# Patient Record
Sex: Male | Born: 1995 | Race: Black or African American | Hispanic: No | Marital: Single | Smoking: Never smoker
Health system: Southern US, Community
[De-identification: ages and names within clinical notes are randomized; demographics above are authoritative.]

## PROBLEM LIST (undated history)

## (undated) DIAGNOSIS — J45909 Unspecified asthma, uncomplicated: Secondary | ICD-10-CM

## (undated) HISTORY — PX: OTHER SURGICAL HISTORY: SHX169

---

## 2019-01-31 ENCOUNTER — Emergency Department (HOSPITAL_COMMUNITY)
Admission: EM | Admit: 2019-01-31 | Discharge: 2019-01-31 | Disposition: A | Discharge status: Home / Self Care | Payer: Medicaid - Out of State | Attending: Emergency Medicine | Admitting: Emergency Medicine

## 2019-01-31 ENCOUNTER — Other Ambulatory Visit: Payer: Self-pay

## 2019-01-31 ENCOUNTER — Encounter (HOSPITAL_COMMUNITY): Payer: Self-pay | Admitting: Emergency Medicine

## 2019-01-31 ENCOUNTER — Emergency Department (HOSPITAL_COMMUNITY): Payer: Medicaid - Out of State

## 2019-01-31 DIAGNOSIS — Y929 Unspecified place or not applicable: Secondary | ICD-10-CM | POA: Insufficient documentation

## 2019-01-31 DIAGNOSIS — Y9389 Activity, other specified: Secondary | ICD-10-CM | POA: Diagnosis not present

## 2019-01-31 DIAGNOSIS — X58XXXA Exposure to other specified factors, initial encounter: Secondary | ICD-10-CM | POA: Insufficient documentation

## 2019-01-31 DIAGNOSIS — Y999 Unspecified external cause status: Secondary | ICD-10-CM | POA: Insufficient documentation

## 2019-01-31 DIAGNOSIS — J45909 Unspecified asthma, uncomplicated: Secondary | ICD-10-CM | POA: Insufficient documentation

## 2019-01-31 DIAGNOSIS — S60221A Contusion of right hand, initial encounter: Secondary | ICD-10-CM | POA: Diagnosis not present

## 2019-01-31 DIAGNOSIS — S6991XA Unspecified injury of right wrist, hand and finger(s), initial encounter: Secondary | ICD-10-CM | POA: Diagnosis present

## 2019-01-31 HISTORY — DX: Unspecified asthma, uncomplicated: J45.909

## 2019-01-31 MED ORDER — IBUPROFEN 800 MG PO TABS
800.0000 mg | ORAL_TABLET | Freq: Once | ORAL | Status: AC
  Administered 2019-01-31: 22:00:00 800 mg via ORAL
  Filled 2019-01-31: qty 1

## 2019-01-31 MED ORDER — IBUPROFEN 800 MG PO TABS: 800.0000 mg | ORAL_TABLET | Freq: Three times a day (TID) | ORAL | 0 refills | Status: DC

## 2019-01-31 NOTE — ED Triage Notes (Signed)
Pt was working on a vehicle when he hit his hand on something and now c/o R hand pain.

## 2019-01-31 NOTE — Discharge Instructions (Addendum)
Rest your hand and wrist by wearing the wrist splint as much as possible, however, remove it several times daily to flex your wrist to prevent stiffness.  Ice and elevation will also help with pain and healing.  See your doctor for a recheck if it is not improving over the next 7-10 days.
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
## 2019-02-15 ENCOUNTER — Emergency Department (HOSPITAL_COMMUNITY)
Admission: EM | Admit: 2019-02-15 | Discharge: 2019-02-15 | Disposition: A | Discharge status: Home / Self Care | Payer: Medicaid - Out of State | Attending: Emergency Medicine | Admitting: Emergency Medicine

## 2019-02-15 ENCOUNTER — Other Ambulatory Visit: Payer: Self-pay

## 2019-02-15 ENCOUNTER — Encounter (HOSPITAL_COMMUNITY): Payer: Self-pay | Admitting: Emergency Medicine

## 2019-02-15 DIAGNOSIS — T7840XA Allergy, unspecified, initial encounter: Secondary | ICD-10-CM | POA: Diagnosis not present

## 2019-02-15 DIAGNOSIS — R21 Rash and other nonspecific skin eruption: Secondary | ICD-10-CM | POA: Diagnosis not present

## 2019-02-15 NOTE — ED Triage Notes (Signed)
Pt states he feels he is having an allergic reaction to penicillin he was given for his tooth. States he has been having a cough, sob, and a rash on his chest with itching. Last took on Thursday. When asked about his symptoms, became adamant this was from pcn and not Covid.

## 2019-02-15 NOTE — ED Provider Notes (Signed)
[facility] EMERGENCY DEPARTMENT Provider Note   CSN: [id number] Arrival date & time: 02/15/19  1409     History   Chief Complaint Chief Complaint  Patient presents with  . Allergic Reaction    HPI Phillip Fleming is a 23 y.o. male.     The history is provided by the patient. No language interpreter was used.  Allergic Reaction Presenting symptoms: rash   Severity:  Moderate Duration:  3 days Prior allergic episodes:  No prior episodes Relieved by:  Nothing Worsened by:  Nothing Ineffective treatments:  None tried Pt reports he had teeth extracted and was placed on pcn.  Pt reports he developed a rash.  Pt tried to call dentist but report they have not returned his call   Past Medical History:  Diagnosis Date  . Asthma     There are no active problems to display for this patient.   History reviewed. No pertinent surgical history.      Home Medications    Prior to Admission medications   Medication Sig Start Date End Date Taking? Authorizing Provider  ibuprofen (ADVIL) 800 MG tablet Take 1 tablet (800 mg total) by mouth 3 (three) times daily. 01/31/19   Evalee Jefferson, PA-C    Family History History reviewed. No pertinent family history.  Social History Social History   Tobacco Use  . Smoking status: Never Smoker  . Smokeless tobacco: Never Used  Substance Use Topics  . Alcohol use: Never    Frequency: Never  . Drug use: Never     Allergies   Patient has no known allergies.   Review of Systems Review of Systems  Skin: Positive for rash.  All other systems reviewed and are negative.    Physical Exam Updated Vital Signs BP 131/89 (BP Location: Right Arm)   Pulse 81   Temp 98.7 F (37.1 C) (Oral)   Resp 15   Ht 6' (1.829 m)   Wt 65.8 kg   SpO2 100%   BMI 19.67 kg/m   Physical Exam Vitals signs reviewed.  HENT:     Head: Normocephalic.  Cardiovascular:     Rate and Rhythm: Normal rate.  Pulmonary:     Effort: Pulmonary effort is  normal.  Musculoskeletal: Normal range of motion.  Skin:    Findings: Rash present.  Neurological:     General: No focal deficit present.  Psychiatric:        Mood and Affect: Mood normal.      ED Treatments / Results  Labs (all labs ordered are listed, but only abnormal results are displayed) Labs Reviewed - No data to display  EKG None  Radiology No results found.  Procedures Procedures (including critical care time)  Medications Ordered in ED Medications - No data to display   Initial Impression / Assessment and Plan / ED Course  I have reviewed the triage vital signs and the nursing notes.  Pertinent labs & imaging results that were available during my care of the patient were reviewed by me and considered in my medical decision making (see chart for details).        MDM  Fine rash  Looks allergic,  Pt reports rash is resolving, Mouth looks good.  No sign of infection   Final Clinical Impressions(s) / ED Diagnoses   Final diagnoses:  Allergic reaction, initial encounter    ED Discharge Orders    None    An After Visit Summary was printed and given to the patient.  Elson Areas, PA-C 02/15/19 1709    Vanetta Mulders, MD 02/19/19 1530

## 2019-02-15 NOTE — Discharge Instructions (Addendum)
Benadryl 25 mg every 4 hours for the next 2 days. Stop antibiotic.  Call your dentist tomorrow to be seen

## 2019-02-21 ENCOUNTER — Other Ambulatory Visit: Payer: Self-pay

## 2019-02-21 ENCOUNTER — Encounter (HOSPITAL_COMMUNITY): Payer: Self-pay | Admitting: Emergency Medicine

## 2019-02-21 ENCOUNTER — Emergency Department (HOSPITAL_COMMUNITY)
Admission: EM | Admit: 2019-02-21 | Discharge: 2019-02-21 | Disposition: A | Discharge status: Home / Self Care | Payer: Medicaid - Out of State | Attending: Emergency Medicine | Admitting: Emergency Medicine

## 2019-02-21 DIAGNOSIS — J45909 Unspecified asthma, uncomplicated: Secondary | ICD-10-CM | POA: Diagnosis not present

## 2019-02-21 DIAGNOSIS — R519 Headache, unspecified: Secondary | ICD-10-CM | POA: Diagnosis present

## 2019-02-21 DIAGNOSIS — K029 Dental caries, unspecified: Secondary | ICD-10-CM | POA: Diagnosis not present

## 2019-02-21 DIAGNOSIS — G43009 Migraine without aura, not intractable, without status migrainosus: Secondary | ICD-10-CM | POA: Insufficient documentation

## 2019-02-21 DIAGNOSIS — K0889 Other specified disorders of teeth and supporting structures: Secondary | ICD-10-CM

## 2019-02-21 MED ORDER — CYCLOBENZAPRINE HCL 10 MG PO TABS
10.0000 mg | ORAL_TABLET | Freq: Once | ORAL | Status: AC
  Administered 2019-02-21: 20:00:00 10 mg via ORAL
  Filled 2019-02-21: qty 1

## 2019-02-21 MED ORDER — ONDANSETRON 8 MG PO TBDP
8.0000 mg | ORAL_TABLET | Freq: Once | ORAL | Status: AC
  Administered 2019-02-21: 20:00:00 8 mg via ORAL
  Filled 2019-02-21: qty 1

## 2019-02-21 MED ORDER — KETOROLAC TROMETHAMINE 10 MG PO TABS
10.0000 mg | ORAL_TABLET | Freq: Once | ORAL | Status: AC
  Administered 2019-02-21: 10 mg via ORAL
  Filled 2019-02-21: qty 1

## 2019-02-21 MED ORDER — CLINDAMYCIN HCL 300 MG PO CAPS: 300.0000 mg | ORAL_CAPSULE | Freq: Four times a day (QID) | ORAL | 0 refills | Status: DC

## 2019-02-21 NOTE — ED Notes (Signed)
Pt reports headache x 2 weeks  Pt is in no distress, not photophobic, no N/V Has no neurologist  Education migraine diary-   Pt has hx of allergies

## 2019-02-21 NOTE — ED Triage Notes (Addendum)
Patient complains of headache x 2 weeks. Pt states it is a "migraine" headache. States unable to sleep due to pain. NAD.

## 2019-02-21 NOTE — Discharge Instructions (Addendum)
Take the antibiotic as directed for your dental pain.  You may also take over-the-counter ibuprofen 600 to 800 mg every 6-8 hours with food if needed for pain.  Follow-up with your primary doctor for recheck.

## 2019-02-21 NOTE — ED Provider Notes (Signed)
[facility] EMERGENCY DEPARTMENT Provider Note   CSN: [id number] Arrival date & time: 02/21/19  1646     History   Chief Complaint Chief Complaint  Patient presents with  . Headache    HPI Phillip Fleming is a 23 y.o. male.     HPI   Phillip Fleming is a 23 y.o. male with past medical history of migraine headaches and asthma.  He presents to the Emergency Department complaining of persistent frontal and right-sided headache of gradual onset.  Symptoms have been present for 2 weeks.  He describes the headache is mostly frontal and occasionally radiating to the right side of his head.  Headache is throbbing in quality.  Headache is associated with photophobia.  He states that he had a dental extraction recently and states the headache has been worse since his tooth was removed. He was prescribed PCN after the extraction, but stopped it due to experiencing a rash after taking it. He was seen here a week ago for the rash.  He denies neck pain or stiffness, fever, chills, vomiting, numbness or weakness of his extremities, and visual changes.  He states the headache is similar to previous headaches.  He has tried over-the-counter Excedrin without relief.   Past Medical History:  Diagnosis Date  . Asthma     There are no active problems to display for this patient.   History reviewed. No pertinent surgical history.    Home Medications    Prior to Admission medications   Medication Sig Start Date End Date Taking? Authorizing Provider  ibuprofen (ADVIL) 800 MG tablet Take 1 tablet (800 mg total) by mouth 3 (three) times daily. 01/31/19   Burgess Amor, PA-C    Family History History reviewed. No pertinent family history.  Social History Social History   Tobacco Use  . Smoking status: Never Smoker  . Smokeless tobacco: Never Used  Substance Use Topics  . Alcohol use: Never    Frequency: Never  . Drug use: Never     Allergies   Patient has no known allergies.   Review  of Systems Review of Systems  Constitutional: Negative for activity change, appetite change and fever.  HENT: Positive for dental problem. Negative for facial swelling and trouble swallowing.   Eyes: Positive for photophobia. Negative for pain and visual disturbance.  Respiratory: Negative for shortness of breath.   Cardiovascular: Negative for chest pain.  Gastrointestinal: Negative for nausea and vomiting.  Musculoskeletal: Negative for arthralgias, neck pain and neck stiffness.  Skin: Negative for rash and wound.  Neurological: Positive for headaches. Negative for dizziness, syncope, facial asymmetry, speech difficulty, weakness and numbness.  Psychiatric/Behavioral: Negative for confusion and decreased concentration.     Physical Exam Updated Vital Signs BP (!) 122/92   Pulse 79   Temp 97.6 F (36.4 C) (Oral)   Resp 18   Ht 6\' 3"  (1.905 m)   Wt 64.9 kg   SpO2 99%   BMI 17.87 kg/m   Physical Exam Vitals signs and nursing note reviewed.  Constitutional:      General: He is not in acute distress.    Appearance: He is well-developed.  HENT:     Head: Normocephalic and atraumatic.     Mouth/Throat:     Mouth: Mucous membranes are moist.     Dentition: Dental tenderness and dental caries present. No gingival swelling or dental abscesses.     Pharynx: Oropharynx is clear. Uvula midline.     Comments: Patient has widespread dental  disease and decay.  There is tenderness to palpation of the right lower first bicuspid.  No excessive erythema or edema of the surrounding gingiva.  No facial edema noted.  No trismus. Eyes:     Conjunctiva/sclera: Conjunctivae normal.     Pupils: Pupils are equal, round, and reactive to light.  Neck:     Musculoskeletal: Normal range of motion and neck supple. No neck rigidity, spinous process tenderness or muscular tenderness.     Trachea: Phonation normal.     Meningeal: Kernig's sign absent.  Cardiovascular:     Rate and Rhythm: Normal rate  and regular rhythm.  Pulmonary:     Effort: Pulmonary effort is normal. No respiratory distress.     Breath sounds: Normal breath sounds.  Musculoskeletal: Normal range of motion.  Skin:    General: Skin is warm and dry.     Capillary Refill: Capillary refill takes less than 2 seconds.     Findings: No rash.  Neurological:     General: No focal deficit present.     Mental Status: He is alert and oriented to person, place, and time.     GCS: GCS eye subscore is 4. GCS verbal subscore is 5. GCS motor subscore is 6.     Cranial Nerves: No cranial nerve deficit.     Sensory: Sensation is intact. No sensory deficit.     Motor: Motor function is intact. No abnormal muscle tone.     Coordination: Coordination is intact. Coordination normal.     Gait: Gait normal.     Deep Tendon Reflexes:     Reflex Scores:      Tricep reflexes are 2+ on the right side and 2+ on the left side.      Bicep reflexes are 2+ on the right side and 2+ on the left side.    Comments: CN III-XII grossly intact.  Speech clear, no pronator drift normal finger-nose testing.  Psychiatric:        Thought Content: Thought content normal.      ED Treatments / Results  Labs (all labs ordered are listed, but only abnormal results are displayed) Labs Reviewed - No data to display  EKG None  Radiology No results found.  Procedures Procedures (including critical care time)  Medications Ordered in ED Medications - No data to display   Initial Impression / Assessment and Plan / ED Course  I have reviewed the triage vital signs and the nursing notes.  Pertinent labs & imaging results that were available during my care of the patient were reviewed by me and considered in my medical decision making (see chart for details).        Patient with 2-week history of gradually worsening headache.  No thunder clap headache.  No focal neuro deficits on exam.  He is well-appearing.  Vital signs reviewed.  No meningeal  signs.  Patient does have tenderness of the right lower bicuspid.  There is no concerning symptoms for dental abscess or Ludewig's angina.  Patient will be started on clindamycin, he agrees to follow-up with his dentist.  Final Clinical Impressions(s) / ED Diagnoses   Final diagnoses:  Migraine without aura and without status migrainosus, not intractable  Pain, dental    ED Discharge Orders    None       Kem Parkinson, PA-C 02/21/19 Jefferson, Beatty, MD 03/03/19 [phone]

## 2019-03-31 DIAGNOSIS — S63601A Unspecified sprain of right thumb, initial encounter: Secondary | ICD-10-CM | POA: Diagnosis not present

## 2019-03-31 DIAGNOSIS — Y929 Unspecified place or not applicable: Secondary | ICD-10-CM | POA: Insufficient documentation

## 2019-03-31 DIAGNOSIS — Z791 Long term (current) use of non-steroidal anti-inflammatories (NSAID): Secondary | ICD-10-CM | POA: Diagnosis not present

## 2019-03-31 DIAGNOSIS — Y999 Unspecified external cause status: Secondary | ICD-10-CM | POA: Insufficient documentation

## 2019-03-31 DIAGNOSIS — X58XXXA Exposure to other specified factors, initial encounter: Secondary | ICD-10-CM | POA: Insufficient documentation

## 2019-03-31 DIAGNOSIS — Y9389 Activity, other specified: Secondary | ICD-10-CM | POA: Insufficient documentation

## 2019-03-31 DIAGNOSIS — S6991XA Unspecified injury of right wrist, hand and finger(s), initial encounter: Secondary | ICD-10-CM | POA: Diagnosis present

## 2019-03-31 DIAGNOSIS — J45909 Unspecified asthma, uncomplicated: Secondary | ICD-10-CM | POA: Insufficient documentation

## 2019-04-01 ENCOUNTER — Emergency Department (HOSPITAL_COMMUNITY): Payer: Medicaid - Out of State

## 2019-04-01 ENCOUNTER — Other Ambulatory Visit: Payer: Self-pay

## 2019-04-01 ENCOUNTER — Encounter (HOSPITAL_COMMUNITY): Payer: Self-pay | Admitting: Emergency Medicine

## 2019-04-01 ENCOUNTER — Emergency Department (HOSPITAL_COMMUNITY)
Admission: EM | Admit: 2019-04-01 | Discharge: 2019-04-01 | Disposition: A | Discharge status: Home / Self Care | Payer: Medicaid - Out of State | Attending: Emergency Medicine | Admitting: Emergency Medicine

## 2019-04-01 DIAGNOSIS — S63601A Unspecified sprain of right thumb, initial encounter: Secondary | ICD-10-CM

## 2019-04-01 MED ORDER — IBUPROFEN 800 MG PO TABS: 800.0000 mg | ORAL_TABLET | Freq: Once | ORAL | Status: DC

## 2019-04-01 MED ORDER — IBUPROFEN 800 MG PO TABS: 800.0000 mg | ORAL_TABLET | Freq: Four times a day (QID) | ORAL | 0 refills | Status: DC | PRN

## 2019-04-01 MED ORDER — ACETAMINOPHEN 500 MG PO TABS: 1000.0000 mg | ORAL_TABLET | Freq: Once | ORAL | Status: DC

## 2019-04-01 NOTE — ED Triage Notes (Signed)
Pt was working on a car and pt stuck hand down near the engine and states something cut his hand up. Bleeding controlled.

## 2019-04-01 NOTE — ED Provider Notes (Signed)
[facility] EMERGENCY DEPARTMENT Provider Note   CSN: [id number] Arrival date & time: 03/31/19  2339     History   Chief Complaint Chief Complaint  Patient presents with  . Hand Injury    HPI Phillip Fleming is a 23 y.o. male.     Patient presents to the emergency department for evaluation of hand injury.  Patient reports that he was working on his car, stuck his hand down into the engine and something injured his hand.  It is not clear if he got into the belt or a moving part, but he is complaining of multiple lacerations and thumb pain.  Tetanus is up-to-date.     Past Medical History:  Diagnosis Date  . Asthma     There are no active problems to display for this patient.   History reviewed. No pertinent surgical history.      Home Medications    Prior to Admission medications   Medication Sig Start Date End Date Taking? Authorizing Provider  clindamycin (CLEOCIN) 300 MG capsule Take 1 capsule (300 mg total) by mouth 4 (four) times daily. For 7 days 02/21/19   Triplett, Tammy, PA-C  ibuprofen (ADVIL) 800 MG tablet Take 1 tablet (800 mg total) by mouth 3 (three) times daily. 01/31/19   Evalee Jefferson, PA-C    Family History History reviewed. No pertinent family history.  Social History Social History   Tobacco Use  . Smoking status: Never Smoker  . Smokeless tobacco: Never Used  Substance Use Topics  . Alcohol use: Never    Frequency: Never  . Drug use: Never     Allergies   Penicillins   Review of Systems Review of Systems  Skin: Positive for wound.  Neurological: Negative.      Physical Exam Updated Vital Signs BP 110/90 (BP Location: Right Arm)   Pulse 81   Temp 97.9 F (36.6 C) (Oral)   Resp 16   Ht 6\' 3"  (1.905 m)   Wt 69.4 kg   SpO2 98%   BMI 19.12 kg/m   Physical Exam Vitals signs and nursing note reviewed.  HENT:     Head: Normocephalic and atraumatic.  Pulmonary:     Breath sounds: Normal breath sounds.  Musculoskeletal:      Right hand: He exhibits tenderness (thumb), laceration (Multiple small superficial linear lacerations on thumb, ring finger and palm) and swelling (Right thumb). He exhibits normal two-point discrimination. Decreased sensation is not present in the ulnar distribution, is not present in the medial distribution and is not present in the radial distribution. He exhibits no finger abduction, no thumb/finger opposition and no wrist extension trouble.      ED Treatments / Results  Labs (all labs ordered are listed, but only abnormal results are displayed) Labs Reviewed - No data to display  EKG None  Radiology Dg Hand Complete Right  Result Date: 04/01/2019 CLINICAL DATA:  Crush injury with fourth first digit pain, initial encounter EXAM: RIGHT HAND - COMPLETE 3+ VIEW COMPARISON:  01/31/2019 FINDINGS: There is no evidence of fracture or dislocation. There is no evidence of arthropathy or other focal bone abnormality. Soft tissues are unremarkable. IMPRESSION: No acute abnormality noted. Electronically Signed   By: Inez Catalina M.D.   On: 04/01/2019 02:06    Procedures Procedures (including critical care time)  Medications Ordered in ED Medications  ibuprofen (ADVIL) tablet 800 mg (has no administration in time range)  acetaminophen (TYLENOL) tablet 1,000 mg (has no administration in time range)  Initial Impression / Assessment and Plan / ED Course  I have reviewed the triage vital signs and the nursing notes.  Pertinent labs & imaging results that were available during my care of the patient were reviewed by me and considered in my medical decision making (see chart for details).        Patient presents to the emergency department for evaluation of hand injury.  Patient somehow injured his hand while working on his car.  I suspect that he was struck by a pulley or moving belt because he has pain, swelling of his right thumb with multiple superficial lacerations.  He does have  a small linear crack on the nail of the thumb as well but no obvious nailbed injury.  None of the wounds are deep or require repair.  Wounds were cleaned, patient placed in thumb spica for comfort.  X-ray was negative for fractures.  Final Clinical Impressions(s) / ED Diagnoses   Final diagnoses:  Sprain of right thumb, unspecified site of digit, initial encounter    ED Discharge Orders    None       Pollina, Canary Brim, MD 04/01/19 [phone]

## 2019-04-13 ENCOUNTER — Emergency Department (HOSPITAL_COMMUNITY)
Admission: EM | Admit: 2019-04-13 | Discharge: 2019-04-13 | Disposition: A | Discharge status: Home / Self Care | Payer: Medicaid - Out of State | Attending: Emergency Medicine | Admitting: Emergency Medicine

## 2019-04-13 ENCOUNTER — Other Ambulatory Visit: Payer: Self-pay

## 2019-04-13 ENCOUNTER — Emergency Department (HOSPITAL_COMMUNITY): Payer: Medicaid - Out of State

## 2019-04-13 ENCOUNTER — Encounter (HOSPITAL_COMMUNITY): Payer: Self-pay

## 2019-04-13 DIAGNOSIS — J45909 Unspecified asthma, uncomplicated: Secondary | ICD-10-CM | POA: Diagnosis not present

## 2019-04-13 DIAGNOSIS — K219 Gastro-esophageal reflux disease without esophagitis: Secondary | ICD-10-CM | POA: Diagnosis not present

## 2019-04-13 DIAGNOSIS — R0789 Other chest pain: Secondary | ICD-10-CM | POA: Diagnosis present

## 2019-04-13 MED ORDER — SODIUM CHLORIDE 0.9% FLUSH: 3.0000 mL | Freq: Once | INTRAVENOUS | Status: DC

## 2019-04-13 MED ORDER — MAALOX 600 MG PO CHEW: 600.0000 mg | CHEWABLE_TABLET | ORAL | 1 refills | Status: AC | PRN

## 2019-04-13 MED ORDER — ALUM & MAG HYDROXIDE-SIMETH 200-200-20 MG/5ML PO SUSP
30.0000 mL | Freq: Once | ORAL | Status: AC
  Administered 2019-04-13: 30 mL via ORAL
  Filled 2019-04-13: qty 30

## 2019-04-13 NOTE — ED Notes (Signed)
Pt resting comfortably on stretcher at this time.

## 2019-04-13 NOTE — ED Provider Notes (Signed)
[facility] EMERGENCY DEPARTMENT Provider Note   CSN: [id number] Arrival date & time: 04/13/19  1639     History   Chief Complaint Chief Complaint  Patient presents with  . Chest Pain    HPI Phillip Fleming is a 23 y.o. male with no significant past medical history presents to the ED with complaints of intermittent substernal chest pain described as "burning" that has occurred for at least 1 year.  Patient reports that it is worse after eating spicy food, but denies any other obvious pattern.  Patient reports that each episode last approximately 10 to 15 minutes before spontaneously resolving and that he endorsed approximately 1 episode every 2 to 3 weeks.  He is followed by primary care provider, but reports that he has only been given albuterol inhalers for his asthma and has not received anything else for his ongoing chest discomfort.  He currently is endorsing mild chest burning, approximately 4 out of 10 severity.  He denies any headache or dizziness, recent illness, fevers or chills, back pain, abdominal pain, palpitations, nausea or vomiting, shortness of breath, dyspnea on exertion, or other symptoms.      HPI  Past Medical History:  Diagnosis Date  . Asthma     There are no active problems to display for this patient.   History reviewed. No pertinent surgical history.      Home Medications    Prior to Admission medications   Medication Sig Start Date End Date Taking? Authorizing Provider  Calcium Carbonate Antacid (MAALOX) 600 MG chewable tablet Chew 1 tablet (600 mg total) by mouth as needed for heartburn. 04/13/19   Lorelee New, PA-C  ibuprofen (ADVIL) 800 MG tablet Take 1 tablet (800 mg total) by mouth every 6 (six) hours as needed for moderate pain. 04/01/19   Gilda Crease, MD    Family History No family history on file.  Social History Social History   Tobacco Use  . Smoking status: Never Smoker  . Smokeless tobacco: Never Used  Substance  Use Topics  . Alcohol use: Never    Frequency: Never  . Drug use: Never     Allergies   Penicillins   Review of Systems Review of Systems  Constitutional: Negative for fever.  Respiratory: Negative for shortness of breath.   Cardiovascular: Positive for chest pain.     Physical Exam Updated Vital Signs BP 119/79 (BP Location: Right Arm)   Pulse 83   Temp 98.3 F (36.8 C) (Oral)   Resp 18   Ht 6\' 3"  (1.905 m)   Wt 69.4 kg   SpO2 99%   BMI 19.12 kg/m   Physical Exam Vitals signs and nursing note reviewed. Exam conducted with a chaperone present.  Constitutional:      Appearance: Normal appearance.  HENT:     Head: Normocephalic and atraumatic.  Eyes:     General: No scleral icterus.    Conjunctiva/sclera: Conjunctivae normal.  Neck:     Musculoskeletal: Normal range of motion and neck supple. No neck rigidity.  Cardiovascular:     Rate and Rhythm: Normal rate and regular rhythm.     Pulses: Normal pulses.     Heart sounds: Normal heart sounds.  Pulmonary:     Effort: Pulmonary effort is normal. No respiratory distress.     Breath sounds: Normal breath sounds. No wheezing or rales.  Abdominal:     General: Abdomen is flat. There is no distension.     Palpations: Abdomen is  soft.     Tenderness: There is no abdominal tenderness. There is no guarding.  Musculoskeletal:     Right lower leg: No edema.     Left lower leg: No edema.  Skin:    General: Skin is dry.  Neurological:     Mental Status: He is alert.     GCS: GCS eye subscore is 4. GCS verbal subscore is 5. GCS motor subscore is 6.  Psychiatric:        Mood and Affect: Mood normal.        Behavior: Behavior normal.        Thought Content: Thought content normal.      ED Treatments / Results  Labs (all labs ordered are listed, but only abnormal results are displayed) Labs Reviewed - No data to display  EKG None  Radiology Dg Chest 2 View  Result Date: 04/13/2019 CLINICAL DATA:  Chest  pain EXAM: CHEST - 2 VIEW COMPARISON:  None. FINDINGS: Lungs are clear.  No pleural effusion or pneumothorax. The heart is normal in size. Visualized osseous structures are within normal limits. IMPRESSION: Normal chest radiographs. Electronically Signed   By: Charline BillsSriyesh  Krishnan M.D.   On: 04/13/2019 18:23    Procedures Procedures (including critical care time)  Medications Ordered in ED Medications  sodium chloride flush (NS) 0.9 % injection 3 mL (3 mLs Intravenous Not Given 04/13/19 2128)  alum & mag hydroxide-simeth (MAALOX/MYLANTA) 200-200-20 MG/5ML suspension 30 mL (30 mLs Oral Given 04/13/19 2128)     Initial Impression / Assessment and Plan / ED Course  I have reviewed the triage vital signs and the nursing notes.  Pertinent labs & imaging results that were available during my care of the patient were reviewed by me and considered in my medical decision making (see chart for details).        DG chest is interpreted and demonstrates no acute cardiopulmonary disease.  Based on patient's history and reported illness, comprehensive evaluation was not warranted.  Low suspicion for ACS.  Patient denies any palpitations or cardiac history.  Instead, his symptoms of substernal chest burning worse with clear trigger such as spicy food is classically consistent with mild, intermittent gastroesophageal reflux disease.  Provided patient with Maalox here in the ED, which provided relief for his chest burning discomfort.  Patient is in agreement with the diagnosis and is satisfied with the work-up.  Given the infrequent nature, do not feel as though prophylactic antacid medication is warranted.  Instead, recommend the patient obtain abortive medications such as Tums or over-the-counter Maalox for symptomatic relief.  Also encouraged him to avoid clear triggers such as spicy food and to keep a food diary to identify other obvious triggers.  Lifestyle modifications discussed.  Strict return precautions  provided to patient. All of the evaluation and work-up results were discussed with the patient and any family at bedside. They were provided opportunity to ask any additional questions and have none at this time. They have expressed understanding of verbal discharge instructions as well as return precautions and are agreeable to the plan.    Final Clinical Impressions(s) / ED Diagnoses   Final diagnoses:  Gastroesophageal reflux disease, unspecified whether esophagitis present    ED Discharge Orders         Ordered    Calcium Carbonate Antacid (MAALOX) 600 MG chewable tablet  As needed     04/13/19 2202           Lorelee NewGreen, Hamzah Savoca L, PA-C 04/13/19  [place], MD 04/17/19 [phone]

## 2019-04-13 NOTE — ED Triage Notes (Signed)
Pt with mid chest pain since yesterday. Pt denies SOB or any other symptoms.

## 2019-04-13 NOTE — Discharge Instructions (Addendum)
Please take your medications, as prescribed.  It is important you follow-up with your PCP regarding today's visit.  May require antacid prophylaxis medication such as a PPI should your episodes of reflux become more frequent or disruptive.  Return to the ED or seek medical attention for any new or worsening symptoms.

## 2019-04-25 ENCOUNTER — Other Ambulatory Visit: Payer: Self-pay

## 2019-04-25 ENCOUNTER — Encounter (HOSPITAL_COMMUNITY): Payer: Self-pay

## 2019-04-25 ENCOUNTER — Emergency Department (HOSPITAL_COMMUNITY)
Admission: EM | Admit: 2019-04-25 | Discharge: 2019-04-25 | Disposition: A | Discharge status: Home / Self Care | Payer: Medicaid - Out of State | Attending: Emergency Medicine | Admitting: Emergency Medicine

## 2019-04-25 DIAGNOSIS — Z79899 Other long term (current) drug therapy: Secondary | ICD-10-CM | POA: Insufficient documentation

## 2019-04-25 DIAGNOSIS — R0981 Nasal congestion: Secondary | ICD-10-CM | POA: Diagnosis present

## 2019-04-25 DIAGNOSIS — J302 Other seasonal allergic rhinitis: Secondary | ICD-10-CM | POA: Insufficient documentation

## 2019-04-25 MED ORDER — CETIRIZINE HCL 10 MG PO TABS: 10.0000 mg | ORAL_TABLET | Freq: Every day | ORAL | 0 refills | Status: AC

## 2019-04-25 MED ORDER — BUDESONIDE 32 MCG/ACT NA SUSP: 1.0000 | Freq: Every day | NASAL | 0 refills | Status: AC

## 2019-04-25 NOTE — ED Notes (Signed)
Pt reports he has an appt with his physician on Monday or Tuesday   Has had sinus infection and he feels that the meds are not working

## 2019-04-25 NOTE — ED Triage Notes (Signed)
Pt reports having sinus problems since November 5th, pt saw PCP, was diagnosed with rhinitis and sent home with medications, reports he has taken them as prescribed and still has symptoms. He was tested for Covid in November and test results were negative.

## 2019-04-25 NOTE — Discharge Instructions (Signed)
Take the medication as directed.  Follow-up with your primary doctor for recheck if needed

## 2019-04-26 NOTE — ED Provider Notes (Signed)
[facility] EMERGENCY DEPARTMENT Provider Note   CSN: [id number] Arrival date & time: 04/25/19  2036     History Chief Complaint  Patient presents with  . Nasal Congestion    since November 5th  . Cough    Phillip Fleming is a 23 y.o. male.  HPI    Phillip Fleming is a 23 y.o. male who presents to the Emergency Department complaining of persistent nasal congestion, runny nose and sneezing.  Symptoms have been present for one month.  Seen by his PCP for this and treated with medication.  He believes the medication was an antibiotic, but he is unsure.  Symptoms have not improved.  He denies cough, sore throat, fever, chills, chest pain and shortness of breath. No headache or neck pain/stiffness.  Had a negative COVID test last month.  States these symptoms usually worsening this time of year and in the Spring.     Past Medical History:  Diagnosis Date  . Asthma     There are no problems to display for this patient.   History reviewed. No pertinent surgical history.     No family history on file.  Social History   Tobacco Use  . Smoking status: Never Smoker  . Smokeless tobacco: Never Used  Substance Use Topics  . Alcohol use: Never  . Drug use: Never    Home Medications Prior to Admission medications   Medication Sig Start Date End Date Taking? Authorizing Provider  albuterol (VENTOLIN HFA) 108 (90 Base) MCG/ACT inhaler SMARTSIG:2 Puff(s) By Mouth Every 4 Hours PRN 04/10/19   [provider]  budesonide (RHINOCORT AQUA) 32 MCG/ACT nasal spray Place 1 spray into both nostrils daily. 04/25/19   Tzvi Economou, PA-C  Calcium Carbonate Antacid (MAALOX) 600 MG chewable tablet Chew 1 tablet (600 mg total) by mouth as needed for heartburn. 04/13/19   Lorelee New, PA-C  cetirizine (ZYRTEC) 10 MG tablet TAKE 1 TABLET BY MOUTH EVERYDAY AT BEDTIME 01/23/19   [provider]  cetirizine (ZYRTEC) 10 MG tablet Take 1 tablet (10 mg total) by mouth daily.  04/25/19   Tasheba Henson, PA-C  [employer] CALCIUM 600 MG tablet SMARTSIG:1 Tablet(s) By Mouth As Needed 04/14/19   [provider]  ibuprofen (ADVIL) 800 MG tablet Take 1 tablet (800 mg total) by mouth every 6 (six) hours as needed for moderate pain. 04/01/19   Gilda Crease, MD  montelukast (SINGULAIR) 10 MG tablet TAKE 1 TABLET BY MOUTH EVERYDAY AT BEDTIME 01/23/19   [provider]    Allergies    Penicillins  Review of Systems   Review of Systems  Constitutional: Negative for activity change, appetite change, chills and fever.  HENT: Positive for congestion and rhinorrhea. Negative for ear pain, facial swelling, sore throat and trouble swallowing.   Eyes: Negative for visual disturbance.  Respiratory: Negative for cough, shortness of breath, wheezing and stridor.   Cardiovascular: Negative for chest pain.  Gastrointestinal: Negative for nausea and vomiting.  Musculoskeletal: Negative for neck pain and neck stiffness.  Skin: Negative for rash.  Neurological: Negative for dizziness, syncope, weakness, numbness and headaches.  Hematological: Negative for adenopathy.  Psychiatric/Behavioral: Negative for confusion.    Physical Exam Updated Vital Signs BP 120/78 (BP Location: Right Arm)   Pulse 91   Temp 98.1 F (36.7 C) (Oral)   Resp 17   Ht 6\' 3"  (1.905 m)   Wt 68 kg   SpO2 96%   BMI 18.75 kg/m   Physical Exam  Vitals and nursing note reviewed.  Constitutional:      General: He is not in acute distress.    Appearance: Normal appearance. He is well-developed. He is not ill-appearing.  HENT:     Head:     Jaw: No trismus.     Right Ear: Tympanic membrane and ear canal normal.     Left Ear: Tympanic membrane and ear canal normal.     Nose: Mucosal edema and rhinorrhea present.     Mouth/Throat:     Mouth: Mucous membranes are moist.     Pharynx: Uvula midline. Posterior oropharyngeal erythema present. No oropharyngeal exudate or uvula swelling.      Tonsils: No tonsillar abscesses.  Eyes:     Conjunctiva/sclera: Conjunctivae normal.  Neck:     Trachea: Phonation normal.     Meningeal: Kernig's sign absent.  Cardiovascular:     Rate and Rhythm: Normal rate and regular rhythm.     Pulses: Normal pulses.  Pulmonary:     Effort: Pulmonary effort is normal. No respiratory distress.     Breath sounds: Normal breath sounds. No wheezing or rales.  Abdominal:     General: There is no distension.     Palpations: Abdomen is soft.     Tenderness: There is no abdominal tenderness.  Musculoskeletal:        General: Normal range of motion.     Cervical back: Normal range of motion and neck supple.  Lymphadenopathy:     Cervical: No cervical adenopathy.  Skin:    General: Skin is warm.     Findings: No rash.  Neurological:     General: No focal deficit present.     Mental Status: He is alert.     Sensory: No sensory deficit.     Motor: No weakness or abnormal muscle tone.     ED Results / Procedures / Treatments   Labs (all labs ordered are listed, but only abnormal results are displayed) Labs Reviewed - No data to display  EKG None  Radiology No results found.  Procedures Procedures (including critical care time)  Medications Ordered in ED Medications - No data to display  ED Course  I have reviewed the triage vital signs and the nursing notes.  Pertinent labs & imaging results that were available during my care of the patient were reviewed by me and considered in my medical decision making (see chart for details).    MDM Rules/Calculators/A&P                      Pt well appearing.  Vitals reviewed.  Sx's likely related to seasonal allergies.  No concerning sx's for sinusitis, doubt COVID infection.    Final Clinical Impression(s) / ED Diagnoses Final diagnoses:  Seasonal allergies    Rx / DC Orders ED Discharge Orders         Ordered    cetirizine (ZYRTEC) 10 MG tablet  Daily     04/25/19 2137     budesonide (RHINOCORT AQUA) 32 MCG/ACT nasal spray  Daily     04/25/19 2137           Kem Parkinson, PA-C 04/26/19 1321    Nat Christen, MD 04/26/19 1624

## 2019-05-07 ENCOUNTER — Encounter (HOSPITAL_COMMUNITY): Payer: Self-pay

## 2019-05-07 ENCOUNTER — Other Ambulatory Visit: Payer: Self-pay

## 2019-05-07 ENCOUNTER — Emergency Department (HOSPITAL_COMMUNITY)
Admission: EM | Admit: 2019-05-07 | Discharge: 2019-05-07 | Disposition: A | Discharge status: Home / Self Care | Payer: Medicaid - Out of State | Attending: Emergency Medicine | Admitting: Emergency Medicine

## 2019-05-07 ENCOUNTER — Emergency Department (HOSPITAL_COMMUNITY): Payer: Medicaid - Out of State

## 2019-05-07 DIAGNOSIS — Z79899 Other long term (current) drug therapy: Secondary | ICD-10-CM | POA: Diagnosis not present

## 2019-05-07 DIAGNOSIS — Y929 Unspecified place or not applicable: Secondary | ICD-10-CM | POA: Diagnosis not present

## 2019-05-07 DIAGNOSIS — S9001XA Contusion of right ankle, initial encounter: Secondary | ICD-10-CM | POA: Diagnosis not present

## 2019-05-07 DIAGNOSIS — J45909 Unspecified asthma, uncomplicated: Secondary | ICD-10-CM | POA: Diagnosis not present

## 2019-05-07 DIAGNOSIS — W208XXA Other cause of strike by thrown, projected or falling object, initial encounter: Secondary | ICD-10-CM | POA: Insufficient documentation

## 2019-05-07 DIAGNOSIS — Y939 Activity, unspecified: Secondary | ICD-10-CM | POA: Diagnosis not present

## 2019-05-07 DIAGNOSIS — S9000XA Contusion of unspecified ankle, initial encounter: Secondary | ICD-10-CM

## 2019-05-07 DIAGNOSIS — S99921A Unspecified injury of right foot, initial encounter: Secondary | ICD-10-CM | POA: Diagnosis present

## 2019-05-07 DIAGNOSIS — Y999 Unspecified external cause status: Secondary | ICD-10-CM | POA: Diagnosis not present

## 2019-05-07 MED ORDER — IBUPROFEN 600 MG PO TABS: 600.0000 mg | ORAL_TABLET | Freq: Four times a day (QID) | ORAL | 0 refills | Status: AC | PRN

## 2019-05-07 NOTE — Discharge Instructions (Signed)
Your x-ray tonight is negative for any acute bony injury including fracture or dislocation.  I recommend using an ice pack as much as is comfortable for the next 24 hours to help minimize pain and swelling.  Elevation will also help with the symptoms.  You may start using a heating pad or a warm water soak for your injury starting Sunday.  Take the medication prescribed for pain and inflammation.  Plan to see your primary doctor for recheck in 1 week if your symptoms are not improving.

## 2019-05-07 NOTE — ED Provider Notes (Signed)
[facility] EMERGENCY DEPARTMENT Provider Note   CSN: [id number] Arrival date & time: 05/07/19  1927     History Chief Complaint  Patient presents with  . Foot Injury    right    Phillip Fleming is a 23 y.o. male presented with complaint of right foot pain sustained an injury late afternoon today.  He was unloading his truck when a piece of firewood fell out and landed on the dorsum of his right foot.  He has had persistent pain since the event.  He has been able to weight-bear but with discomfort.  He denies numbness distal to the injury site.  He has had no treatment for this injury prior to arrival.  HPI     Past Medical History:  Diagnosis Date  . Asthma     There are no problems to display for this patient.   History reviewed. No pertinent surgical history.     History reviewed. No pertinent family history.  Social History   Tobacco Use  . Smoking status: Never Smoker  . Smokeless tobacco: Never Used  Substance Use Topics  . Alcohol use: Never  . Drug use: Never    Home Medications Prior to Admission medications   Medication Sig Start Date End Date Taking? Authorizing Provider  albuterol (VENTOLIN HFA) 108 (90 Base) MCG/ACT inhaler SMARTSIG:2 Puff(s) By Mouth Every 4 Hours PRN 04/10/19   [provider]  budesonide (RHINOCORT AQUA) 32 MCG/ACT nasal spray Place 1 spray into both nostrils daily. 04/25/19   Triplett, Tammy, PA-C  Calcium Carbonate Antacid (MAALOX) 600 MG chewable tablet Chew 1 tablet (600 mg total) by mouth as needed for heartburn. 04/13/19   Lorelee New, PA-C  cetirizine (ZYRTEC) 10 MG tablet TAKE 1 TABLET BY MOUTH EVERYDAY AT BEDTIME 01/23/19   [provider]  cetirizine (ZYRTEC) 10 MG tablet Take 1 tablet (10 mg total) by mouth daily. 04/25/19   Triplett, Tammy, PA-C  [employer] CALCIUM 600 MG tablet SMARTSIG:1 Tablet(s) By Mouth As Needed 04/14/19   [provider]  ibuprofen (ADVIL) 600 MG tablet Take 1 tablet (600 mg  total) by mouth every 6 (six) hours as needed. 05/07/19   Burgess Amor, PA-C  montelukast (SINGULAIR) 10 MG tablet TAKE 1 TABLET BY MOUTH EVERYDAY AT BEDTIME 01/23/19   [provider]    Allergies    Penicillins  Review of Systems   Review of Systems  Constitutional: Negative for fever.  Musculoskeletal: Positive for arthralgias. Negative for joint swelling and myalgias.  Neurological: Negative for weakness and numbness.    Physical Exam Updated Vital Signs BP 127/79 (BP Location: Right Arm)   Pulse 98   Temp (!) 97.5 F (36.4 C) (Oral)   Resp 20   Ht 6\' 3"  (1.905 m)   Wt 68 kg   SpO2 96%   BMI 18.75 kg/m   Physical Exam Constitutional:      Appearance: He is well-developed.  HENT:     Head: Atraumatic.  Cardiovascular:     Comments: Pulses equal bilaterally Musculoskeletal:        General: Tenderness present.     Cervical back: Normal range of motion.     Right ankle: No swelling, deformity or ecchymosis. Tenderness present. Normal range of motion. Normal pulse.     Right Achilles Tendon: Normal.     Right foot: Normal.  Skin:    General: Skin is warm and dry.  Neurological:     Mental Status: He is alert.  Sensory: No sensory deficit.     Deep Tendon Reflexes: Reflexes normal.     ED Results / Procedures / Treatments   Labs (all labs ordered are listed, but only abnormal results are displayed) Labs Reviewed - No data to display  EKG None  Radiology DG Ankle Complete Right  Result Date: 05/07/2019 CLINICAL DATA:  Right ankle pain after injury today. EXAM: RIGHT ANKLE - COMPLETE 3+ VIEW COMPARISON:  None. FINDINGS: There is no evidence of fracture, dislocation, or joint effusion. There is no evidence of arthropathy or other focal bone abnormality. Soft tissues are unremarkable. IMPRESSION: Negative. Electronically Signed   By: Marijo Conception M.D.   On: 05/07/2019 20:26    Procedures Procedures (including critical care time)  Medications  Ordered in ED Medications - No data to display  ED Course  I have reviewed the triage vital signs and the nursing notes.  Pertinent labs & imaging results that were available during my care of the patient were reviewed by me and considered in my medical decision making (see chart for details).    MDM Rules/Calculators/A&P                      X-rays of his right ankle were reviewed and interpreted.  There are no abnormalities including fracture or dislocation.  His exam is unremarkable, he does have point tenderness at his anterior ankle joint, there is no edema, ecchymosis, hematoma.  He has no visible signs of trauma and he does display full range of motion of the ankle joint.  He was placed in a [employer] dressing, we discussed roles of ice and heat, ibuprofen prescribed.  Plan follow-up with his PCP for recheck in 1 week if symptoms are not improving. Final Clinical Impression(s) / ED Diagnoses Final diagnoses:  Contusion of ankle, initial encounter    Rx / DC Orders ED Discharge Orders         Ordered    ibuprofen (ADVIL) 600 MG tablet  Every 6 hours PRN     05/07/19 2039           Evalee Jefferson, Hershal Coria 05/07/19 2213    Noemi Chapel, MD 05/08/19 [phone]

## 2019-05-07 NOTE — ED Triage Notes (Signed)
Pt states he dropped a piece of wood on his right foot earlier when unloading his truck.

## 2019-07-04 ENCOUNTER — Encounter (HOSPITAL_COMMUNITY): Payer: Self-pay | Admitting: Emergency Medicine

## 2019-07-04 ENCOUNTER — Other Ambulatory Visit: Payer: Self-pay

## 2019-07-04 ENCOUNTER — Emergency Department (HOSPITAL_COMMUNITY)
Admission: EM | Admit: 2019-07-04 | Discharge: 2019-07-05 | Disposition: A | Discharge status: Home / Self Care | Payer: Medicaid - Out of State | Attending: Emergency Medicine | Admitting: Emergency Medicine

## 2019-07-04 ENCOUNTER — Emergency Department (HOSPITAL_COMMUNITY): Payer: Medicaid - Out of State

## 2019-07-04 DIAGNOSIS — R1013 Epigastric pain: Secondary | ICD-10-CM | POA: Diagnosis not present

## 2019-07-04 DIAGNOSIS — R079 Chest pain, unspecified: Secondary | ICD-10-CM | POA: Diagnosis present

## 2019-07-04 DIAGNOSIS — J45909 Unspecified asthma, uncomplicated: Secondary | ICD-10-CM | POA: Insufficient documentation

## 2019-07-04 DIAGNOSIS — Z79899 Other long term (current) drug therapy: Secondary | ICD-10-CM | POA: Insufficient documentation

## 2019-07-04 LAB — CBC WITH DIFFERENTIAL/PLATELET
Abs Immature Granulocytes: 0 10*3/uL (ref 0.00–0.07)
Basophils Absolute: 0 10*3/uL (ref 0.0–0.1)
Basophils Relative: 1 %
Eosinophils Absolute: 0.4 10*3/uL (ref 0.0–0.5)
Eosinophils Relative: 9 %
HCT: 44.2 % (ref 39.0–52.0)
Hemoglobin: 14.9 g/dL (ref 13.0–17.0)
Immature Granulocytes: 0 %
Lymphocytes Relative: 50 %
Lymphs Abs: 2.4 10*3/uL (ref 0.7–4.0)
MCH: 33 pg (ref 26.0–34.0)
MCHC: 33.7 g/dL (ref 30.0–36.0)
MCV: 97.8 fL (ref 80.0–100.0)
Monocytes Absolute: 0.4 10*3/uL (ref 0.1–1.0)
Monocytes Relative: 8 %
Neutro Abs: 1.5 10*3/uL — ABNORMAL LOW (ref 1.7–7.7)
Neutrophils Relative %: 32 %
Platelets: 178 10*3/uL (ref 150–400)
RBC: 4.52 MIL/uL (ref 4.22–5.81)
RDW: 12.6 % (ref 11.5–15.5)
WBC: 4.8 10*3/uL (ref 4.0–10.5)
nRBC: 0 % (ref 0.0–0.2)

## 2019-07-04 MED ORDER — SODIUM CHLORIDE 0.9 % IV BOLUS
1000.0000 mL | Freq: Once | INTRAVENOUS | Status: AC
  Administered 2019-07-05: 1000 mL via INTRAVENOUS

## 2019-07-04 NOTE — ED Provider Notes (Signed)
[facility] EMERGENCY DEPARTMENT Provider Note   CSN: [id number] Arrival date & time: 07/04/19  2247     History Chief Complaint  Patient presents with  . Chest Pain    Phillip Fleming is a 24 y.o. male.  Patient presents to the emergency department for evaluation of chest pain, abdominal pain, back pain.  Symptoms began this morning and have been continuous.  Patient reports pain in the central upper portion of the abdomen and up into the upper chest.  He has had some pain around the right flank and into the back as well.  No difficulty breathing.  Patient reports he has tried over-the-counter pain medication as well as antacids without improvement.  No history of sudden cardiac death in his family.        Past Medical History:  Diagnosis Date  . Asthma     There are no problems to display for this patient.   History reviewed. No pertinent surgical history.     No family history on file.  Social History   Tobacco Use  . Smoking status: Never Smoker  . Smokeless tobacco: Never Used  Substance Use Topics  . Alcohol use: Never  . Drug use: Never    Home Medications Prior to Admission medications   Medication Sig Start Date End Date Taking? Authorizing Provider  albuterol (VENTOLIN HFA) 108 (90 Base) MCG/ACT inhaler SMARTSIG:2 Puff(s) By Mouth Every 4 Hours PRN 04/10/19   [provider]  budesonide (RHINOCORT AQUA) 32 MCG/ACT nasal spray Place 1 spray into both nostrils daily. 04/25/19   Triplett, Tammy, PA-C  Calcium Carbonate Antacid (MAALOX) 600 MG chewable tablet Chew 1 tablet (600 mg total) by mouth as needed for heartburn. 04/13/19   Lorelee New, PA-C  cetirizine (ZYRTEC) 10 MG tablet TAKE 1 TABLET BY MOUTH EVERYDAY AT BEDTIME 01/23/19   [provider]  cetirizine (ZYRTEC) 10 MG tablet Take 1 tablet (10 mg total) by mouth daily. 04/25/19   Triplett, Tammy, PA-C  [employer] CALCIUM 600 MG tablet SMARTSIG:1 Tablet(s) By Mouth As Needed 04/14/19    [provider]  ibuprofen (ADVIL) 600 MG tablet Take 1 tablet (600 mg total) by mouth every 6 (six) hours as needed. 05/07/19   Idol, Raynelle Fanning, PA-C  montelukast (SINGULAIR) 10 MG tablet TAKE 1 TABLET BY MOUTH EVERYDAY AT BEDTIME 01/23/19   [provider]  pantoprazole (PROTONIX) 40 MG tablet Take 1 tablet (40 mg total) by mouth daily. 07/05/19   Gilda Crease, MD    Allergies    Penicillins  Review of Systems   Review of Systems  Cardiovascular: Positive for chest pain.  Gastrointestinal: Positive for abdominal pain.  Musculoskeletal: Positive for back pain.  All other systems reviewed and are negative.   Physical Exam Updated Vital Signs BP 118/89   Pulse 81   Temp 97.8 F (36.6 C) (Oral)   Resp 19   Wt 68 kg   SpO2 100%   BMI 18.75 kg/m   Physical Exam Vitals and nursing note reviewed.  Constitutional:      General: He is not in acute distress.    Appearance: Normal appearance. He is well-developed.  HENT:     Head: Normocephalic and atraumatic.     Right Ear: Hearing normal.     Left Ear: Hearing normal.     Nose: Nose normal.  Eyes:     Conjunctiva/sclera: Conjunctivae normal.     Pupils: Pupils are equal, round, and reactive to light.  Cardiovascular:  Rate and Rhythm: Regular rhythm.     Heart sounds: S1 normal and S2 normal. No murmur. No friction rub. No gallop.   Pulmonary:     Effort: Pulmonary effort is normal. No respiratory distress.     Breath sounds: Normal breath sounds.  Chest:     Chest wall: No tenderness.  Abdominal:     General: Bowel sounds are normal.     Palpations: Abdomen is soft.     Tenderness: There is abdominal tenderness in the epigastric area. There is no guarding or rebound. Negative signs include Murphy's sign and McBurney's sign.     Hernia: No hernia is present.  Musculoskeletal:        General: Normal range of motion.     Cervical back: Normal range of motion and neck supple.  Skin:    General:  Skin is warm and dry.     Findings: No rash.  Neurological:     Mental Status: He is alert and oriented to person, place, and time.     GCS: GCS eye subscore is 4. GCS verbal subscore is 5. GCS motor subscore is 6.     Cranial Nerves: No cranial nerve deficit.     Sensory: No sensory deficit.     Coordination: Coordination normal.  Psychiatric:        Speech: Speech normal.        Behavior: Behavior normal.        Thought Content: Thought content normal.     ED Results / Procedures / Treatments   Labs (all labs ordered are listed, but only abnormal results are displayed) Labs Reviewed  CBC WITH DIFFERENTIAL/PLATELET - Abnormal; Notable for the following components:      Result Value   Neutro Abs 1.5 (*)    All other components within normal limits  COMPREHENSIVE METABOLIC PANEL  LIPASE, BLOOD  TROPONIN I (HIGH SENSITIVITY)    EKG EKG Interpretation  Date/Time:  Saturday July 04 2019 23:02:53 EST Ventricular Rate:  83 PR Interval:    QRS Duration: 96 QT Interval:  383 QTC Calculation: 450 R Axis:   29 Text Interpretation: Sinus rhythm Normal ECG Confirmed by Gilda Crease [id number]) on 07/04/2019 11:19:22 PM   Radiology CT ANGIO CHEST/ABD/PEL FOR DISSECTION W &/OR WO CONTRAST  Result Date: 07/05/2019 CLINICAL DATA:  Chest pain and back pain, started this morning EXAM: CT ANGIOGRAPHY CHEST, ABDOMEN AND PELVIS TECHNIQUE: Multidetector CT imaging through the chest, abdomen and pelvis was performed using the standard protocol during bolus administration of intravenous contrast. Multiplanar reconstructed images and MIPs were obtained and reviewed to evaluate the vascular anatomy. CONTRAST:  OMNIPAQUE IOHEXOL 350 MG/ML SOLN COMPARISON:  None. FINDINGS: CTA CHEST FINDINGS Cardiovascular: --Heart: The heart size is normal.  There is nopericardial effusion. --Aorta: The course and caliber of the thoracic aorta are normal. There is no aortic atherosclerotic  calcification. Precontrast images show no aortic intramural hematoma. There is no blood pool, dissection or penetrating ulcer demonstrated on arterial phase postcontrast imaging. There is a conventional 3 vessel aortic arch branching pattern. The proximal arch vessels are widely patent. --Pulmonary Arteries: Contrast timing is optimized for preferential opacification of the aorta. Within that limitation, normal central pulmonary arteries. Mediastinum/Nodes: No mediastinal, hilar or axillary lymphadenopathy. The visualized thyroid and thoracic esophageal course are unremarkable. Lungs/Pleura: There is minimal bronchiectasis and bronchial wall thickening seen within the anterior right middle lobe, series 7, image 110. No pleural effusion or pneumothorax. No focal airspace consolidation. No  focal pleural abnormality. Musculoskeletal: No chest wall abnormality. No acute osseous findings. Review of the MIP images confirms the above findings. CTA ABDOMEN AND PELVIS FINDINGS VASCULAR Aorta: Normal caliber aorta without aneurysm, dissection, vasculitis or hemodynamically significant stenosis. There is no aortic atherosclerosis. Celiac: No aneurysm, dissection or hemodynamically significant stenosis. Normal branching pattern SMA: Widely patent without dissection or stenosis. Renals: Single renal arteries bilaterally. No aneurysm, dissection, stenosis or evidence of fibromuscular dysplasia. IMA: Patent without abnormality. Inflow: No aneurysm, stenosis or dissection. Veins: Normal course and caliber of the major veins. Assessment is otherwise limited by the arterial dominant contrast phase. Review of the MIP images confirms the above findings. NON-VASCULAR Hepatobiliary: Normal hepatic contours and density. No visible biliary dilatation. Normal gallbladder. Pancreas: Normal contours without ductal dilatation. No peripancreatic fluid collection. Spleen: Normal arterial phase splenic enhancement pattern. Adrenals/Urinary Tract:  --Adrenal glands: Normal. --Right kidney/ureter: No hydronephrosis or perinephric stranding. No nephrolithiasis. No obstructing ureteral stones. --Left kidney/ureter: No hydronephrosis or perinephric stranding. No nephrolithiasis. No obstructing ureteral stones. --Urinary bladder: Unremarkable. Stomach/Bowel: --Stomach/Duodenum: No hiatal hernia or other gastric abnormality. Normal duodenal course and caliber. --Small bowel: No dilatation or inflammation. --Colon: No focal abnormality. --Appendix: Normal. Lymphatic:  No abdominal or pelvic lymphadenopathy. Reproductive: No free fluid in the pelvis. Musculoskeletal. No bony spinal canal stenosis or focal osseous abnormality. Other: None. Review of the MIP images confirms the above findings. IMPRESSION: No acute aortic or other vascular abnormality. No acute intrathoracic, abdominal, or pelvic pathology to explain the patient's symptoms. Minimal bronchial wall thickening and bronchiectasis within the right middle lobe which could be from prior infectious or inflammatory process. Electronically Signed   By: Prudencio Pair M.D.   On: 07/05/2019 00:29    Procedures Procedures (including critical care time)  Medications Ordered in ED Medications  sodium chloride 0.9 % bolus 1,000 mL (0 mLs Intravenous Stopped 07/05/19 0104)  iohexol (OMNIPAQUE) 350 MG/ML injection 100 mL (100 mLs Intravenous Contrast Given 07/05/19 0012)  famotidine (PEPCID) tablet 40 mg (40 mg Oral Given 07/05/19 0100)  alum & mag hydroxide-simeth (MAALOX/MYLANTA) 200-200-20 MG/5ML suspension 30 mL (30 mLs Oral Given 07/05/19 0101)    And  lidocaine (XYLOCAINE) 2 % viscous mouth solution 15 mL (15 mLs Oral Given 07/05/19 0101)    ED Course  I have reviewed the triage vital signs and the nursing notes.  Pertinent labs & imaging results that were available during my care of the patient were reviewed by me and considered in my medical decision making (see chart for details).    MDM  Rules/Calculators/A&P                      Differential diagnosis: Chest wall pain, GI pain (gastritis, GERD), gallbladder disease, aortic dissection  Patient presents with chest pain, abdominal pain, back pain.  Symptoms have been present all day.  He does not have any cardiac risk factors for ACS.  EKG is normal.  Patient is tall and thin, body habitus could fit with aortic dissection.  Examination does revealed epigastric tenderness without guarding or rebound.  No pulsatile mass.  No Murphy sign.  Troponin negative.  CT chest abdomen and pelvis without evidence of aortic dissection/AAA.  No other intra-abdominal pathology.  Patient does report that he has a history of GERD.  Will maximize reflux medication.  No further work-up necessary.  Final Clinical Impression(s) / ED Diagnoses Final diagnoses:  Epigastric abdominal pain    Rx / DC Orders ED Discharge Orders  Ordered    pantoprazole (PROTONIX) 40 MG tablet  Daily     07/05/19 0055           Gilda Crease, MD 07/06/19 [phone]

## 2019-07-04 NOTE — ED Triage Notes (Signed)
Pt C/O chest and back pain that started this morning. Denies N/V, SOB, or diaphoresis. Pt states the pain gets worse when "I over work."

## 2019-07-05 LAB — COMPREHENSIVE METABOLIC PANEL
ALT: 17 U/L (ref 0–44)
AST: 16 U/L (ref 15–41)
Albumin: 4.5 g/dL (ref 3.5–5.0)
Alkaline Phosphatase: 117 U/L (ref 38–126)
Anion gap: 8 (ref 5–15)
BUN: 12 mg/dL (ref 6–20)
CO2: 27 mmol/L (ref 22–32)
Calcium: 9 mg/dL (ref 8.9–10.3)
Chloride: 106 mmol/L (ref 98–111)
Creatinine, Ser: 0.95 mg/dL (ref 0.61–1.24)
GFR calc Af Amer: >60 mL/min (ref >60)
GFR calc non Af Amer: >60 mL/min (ref >60)
Glucose, Bld: 88 mg/dL (ref 70–99)
Potassium: 3.9 mmol/L (ref 3.5–5.1)
Sodium: 141 mmol/L (ref 135–145)
Total Bilirubin: 0.5 mg/dL (ref 0.3–1.2)
Total Protein: 7.3 g/dL (ref 6.5–8.1)

## 2019-07-05 LAB — LIPASE, BLOOD: Lipase: 28 U/L (ref 11–51)

## 2019-07-05 LAB — TROPONIN I (HIGH SENSITIVITY): Troponin I (High Sensitivity): <2 ng/L (ref <18)

## 2019-07-05 MED ORDER — LIDOCAINE VISCOUS HCL 2 % MT SOLN
15.0000 mL | Freq: Once | OROMUCOSAL | Status: AC
  Administered 2019-07-05: 01:00:00 15 mL via ORAL
  Filled 2019-07-05: qty 15

## 2019-07-05 MED ORDER — PANTOPRAZOLE SODIUM 40 MG PO TBEC: 40.0000 mg | DELAYED_RELEASE_TABLET | Freq: Every day | ORAL | 3 refills | Status: AC

## 2019-07-05 MED ORDER — FAMOTIDINE 20 MG PO TABS
40.0000 mg | ORAL_TABLET | Freq: Once | ORAL | Status: AC
  Administered 2019-07-05: 40 mg via ORAL
  Filled 2019-07-05: qty 2

## 2019-07-05 MED ORDER — ALUM & MAG HYDROXIDE-SIMETH 200-200-20 MG/5ML PO SUSP
30.0000 mL | Freq: Once | ORAL | Status: AC
  Administered 2019-07-05: 30 mL via ORAL
  Filled 2019-07-05: qty 30

## 2019-07-05 MED ORDER — IOHEXOL 350 MG/ML SOLN
100.0000 mL | Freq: Once | INTRAVENOUS | Status: AC | PRN
  Administered 2019-07-05: 100 mL via INTRAVENOUS

## 2020-01-04 ENCOUNTER — Other Ambulatory Visit: Payer: Self-pay

## 2020-01-04 ENCOUNTER — Emergency Department (HOSPITAL_COMMUNITY)
Admission: EM | Admit: 2020-01-04 | Discharge: 2020-01-04 | Disposition: A | Discharge status: Home / Self Care | Payer: Medicaid - Out of State | Attending: Emergency Medicine | Admitting: Emergency Medicine

## 2020-01-04 DIAGNOSIS — Z79899 Other long term (current) drug therapy: Secondary | ICD-10-CM | POA: Insufficient documentation

## 2020-01-04 DIAGNOSIS — J45909 Unspecified asthma, uncomplicated: Secondary | ICD-10-CM | POA: Insufficient documentation

## 2020-01-04 DIAGNOSIS — M79645 Pain in left finger(s): Secondary | ICD-10-CM | POA: Diagnosis present

## 2020-01-04 DIAGNOSIS — L02512 Cutaneous abscess of left hand: Secondary | ICD-10-CM

## 2020-01-04 MED ORDER — LIDOCAINE HCL (PF) 2 % IJ SOLN
10.0000 mL | Freq: Once | INTRAMUSCULAR | Status: AC
  Administered 2020-01-04: 10 mL

## 2020-01-04 NOTE — ED Triage Notes (Signed)
Pt diagnosed with cellulitis of L thumb and has since finished his coarse of antibiotics but states that the area on his thumb is "darker and he has been having tingling to that area". Here for recheck.

## 2020-01-04 NOTE — ED Provider Notes (Signed)
[facility] EMERGENCY DEPARTMENT Provider Note   CSN: [id number] Arrival date & time: 01/04/20  3762     History Chief Complaint  Patient presents with  . Follow-up    Phillip Fleming is a 24 y.o. male.  Patient presents to the emergency department with complaints of persistent pain of his left thumb.  Patient reports that he was seen at an urgent care in [place] a week and a half ago and diagnosed with cellulitis.  He was placed on doxycycline.  Patient reports that he just finished the doxycycline but there is still pain and swelling over the knuckle.  He has not noticed any drainage.        Past Medical History:  Diagnosis Date  . Asthma     There are no problems to display for this patient.   No past surgical history on file.     No family history on file.  Social History   Tobacco Use  . Smoking status: Never Smoker  . Smokeless tobacco: Never Used  Vaping Use  . Vaping Use: Never used  Substance Use Topics  . Alcohol use: Never  . Drug use: Never    Home Medications Prior to Admission medications   Medication Sig Start Date End Date Taking? Authorizing Provider  albuterol (VENTOLIN HFA) 108 (90 Base) MCG/ACT inhaler SMARTSIG:2 Puff(s) By Mouth Every 4 Hours PRN 04/10/19   [provider]  budesonide (RHINOCORT AQUA) 32 MCG/ACT nasal spray Place 1 spray into both nostrils daily. 04/25/19   Triplett, Tammy, PA-C  Calcium Carbonate Antacid (MAALOX) 600 MG chewable tablet Chew 1 tablet (600 mg total) by mouth as needed for heartburn. 04/13/19   Lorelee New, PA-C  cetirizine (ZYRTEC) 10 MG tablet TAKE 1 TABLET BY MOUTH EVERYDAY AT BEDTIME 01/23/19   [provider]  cetirizine (ZYRTEC) 10 MG tablet Take 1 tablet (10 mg total) by mouth daily. 04/25/19   Triplett, Tammy, PA-C  [employer] CALCIUM 600 MG tablet SMARTSIG:1 Tablet(s) By Mouth As Needed 04/14/19   [provider]  ibuprofen (ADVIL) 600 MG tablet Take 1 tablet (600 mg total)  by mouth every 6 (six) hours as needed. 05/07/19   Idol, Raynelle Fanning, PA-C  montelukast (SINGULAIR) 10 MG tablet TAKE 1 TABLET BY MOUTH EVERYDAY AT BEDTIME 01/23/19   [provider]  pantoprazole (PROTONIX) 40 MG tablet Take 1 tablet (40 mg total) by mouth daily. 07/05/19   Gilda Crease, MD    Allergies    Penicillins  Review of Systems   Review of Systems  Skin: Positive for wound.  Neurological: Negative.     Physical Exam Updated Vital Signs BP 123/83 (BP Location: Right Arm)   Pulse 64   Temp 97.8 F (36.6 C) (Oral)   Resp 18   Ht 6\' 3"  (1.905 m)   Wt 65.8 kg   SpO2 100%   BMI 18.12 kg/m   Physical Exam Vitals and nursing note reviewed.  Constitutional:      Appearance: Normal appearance.  HENT:     Head: Atraumatic.  Musculoskeletal:     Left hand: Swelling (Dorsal aspect of IP joint, thumb) and tenderness (Dorsal aspect of IP joint, thumb) present. No deformity or lacerations. Normal range of motion. Normal strength. Normal sensation. There is no disruption of two-point discrimination. Normal capillary refill.  Skin:    Comments: Small tender nodule over dorsal aspect of left thumb IP joint with slight fluctuance.  Area is slightly hyperpigmented, no erythema, no warmth, no  induration     ED Results / Procedures / Treatments   Labs (all labs ordered are listed, but only abnormal results are displayed) Labs Reviewed - No data to display  EKG None  Radiology No results found.  Procedures .Marland KitchenIncision and Drainage  Date/Time: 01/04/2020 6:53 AM Performed by: Gilda Crease, MD Authorized by: Gilda Crease, MD   Consent:    Consent obtained:  Verbal   Consent given by:  Patient   Risks discussed:  Bleeding, incomplete drainage and pain Universal protocol:    Procedure explained and questions answered to patient or proxy's satisfaction: yes     Site/side marked: yes     Immediately prior to procedure a time out was called:  yes     Patient identity confirmed:  Verbally with patient Location:    Type:  Abscess   Size:  1cm   Location:  Upper extremity   Upper extremity location:  Finger   Finger location:  L thumb Pre-procedure details:    Skin preparation:  Antiseptic wash Anesthesia (see MAR for exact dosages):    Anesthesia method:  Nerve block   Block location:  Thumb   Block needle gauge:  25 G   Block anesthetic:  Lidocaine 2% w/o epi   Block technique:  Digital   Block injection procedure:  Anatomic landmarks identified, introduced needle, incremental injection, negative aspiration for blood and anatomic landmarks palpated   Block outcome:  Anesthesia achieved Procedure type:    Complexity:  Simple Procedure details:    Incision types:  Single straight   Incision depth:  Dermal   Scalpel blade:  11   Wound management:  Probed and deloculated   Drainage:  Purulent   Drainage amount:  Moderate   Wound treatment:  Wound left open   Packing materials:  None Post-procedure details:    Patient tolerance of procedure:  Tolerated well, no immediate complications   (including critical care time)  Medications Ordered in ED Medications  lidocaine HCl (PF) (XYLOCAINE) 2 % injection 10 mL (has no administration in time range)    ED Course  I have reviewed the triage vital signs and the nursing notes.  Pertinent labs & imaging results that were available during my care of the patient were reviewed by me and considered in my medical decision making (see chart for details).    MDM Rules/Calculators/A&P                          Abscess drained, no associated cellulitis, should not need any additional antibiotics.  Final Clinical Impression(s) / ED Diagnoses Final diagnoses:  Abscess of left thumb    Rx / DC Orders ED Discharge Orders    None       Antavius Sperbeck, Canary Brim, MD 01/04/20 [phone]

## 2020-01-25 ENCOUNTER — Other Ambulatory Visit: Payer: Self-pay

## 2020-01-25 ENCOUNTER — Encounter (HOSPITAL_COMMUNITY): Payer: Self-pay | Admitting: Emergency Medicine

## 2020-01-25 DIAGNOSIS — R319 Hematuria, unspecified: Secondary | ICD-10-CM | POA: Insufficient documentation

## 2020-01-25 DIAGNOSIS — K5900 Constipation, unspecified: Secondary | ICD-10-CM | POA: Diagnosis not present

## 2020-01-25 DIAGNOSIS — Z7951 Long term (current) use of inhaled steroids: Secondary | ICD-10-CM | POA: Insufficient documentation

## 2020-01-25 DIAGNOSIS — R109 Unspecified abdominal pain: Secondary | ICD-10-CM | POA: Diagnosis not present

## 2020-01-25 DIAGNOSIS — J45909 Unspecified asthma, uncomplicated: Secondary | ICD-10-CM | POA: Diagnosis not present

## 2020-01-25 NOTE — ED Triage Notes (Signed)
Pt c/o right flank pain and frequent urination since Sat.

## 2020-01-26 ENCOUNTER — Emergency Department (HOSPITAL_COMMUNITY): Payer: Medicaid - Out of State

## 2020-01-26 ENCOUNTER — Emergency Department (HOSPITAL_COMMUNITY)
Admission: EM | Admit: 2020-01-26 | Discharge: 2020-01-26 | Disposition: A | Discharge status: Home / Self Care | Payer: Medicaid - Out of State | Attending: Emergency Medicine | Admitting: Emergency Medicine

## 2020-01-26 DIAGNOSIS — K5900 Constipation, unspecified: Secondary | ICD-10-CM

## 2020-01-26 DIAGNOSIS — R109 Unspecified abdominal pain: Secondary | ICD-10-CM

## 2020-01-26 LAB — URINALYSIS, ROUTINE W REFLEX MICROSCOPIC
Bilirubin Urine: NEGATIVE
Glucose, UA: NEGATIVE mg/dL
Ketones, ur: NEGATIVE mg/dL
Leukocytes,Ua: NEGATIVE
Nitrite: NEGATIVE
Protein, ur: NEGATIVE mg/dL
Specific Gravity, Urine: >1.03 — ABNORMAL HIGH (ref 1.005–1.030)
pH: 6 (ref 5.0–8.0)

## 2020-01-26 LAB — URINALYSIS, MICROSCOPIC (REFLEX)
Bacteria, UA: NONE SEEN
Squamous Epithelial / HPF: NONE SEEN (ref 0–5)
WBC, UA: NONE SEEN WBC/hpf (ref 0–5)

## 2020-01-26 NOTE — Discharge Instructions (Addendum)
You were seen today for flank pain.  Your work-up is largely reassuring.  You do have significant retained stool on your CT scan.  This could be contributing to your discomfort.  You need to increase fiber and fluids in your diet.  You may take MiraLAX 1 capful 1-2 times daily to improve frequency of stooling.

## 2020-01-26 NOTE — ED Provider Notes (Signed)
[facility] EMERGENCY DEPARTMENT Provider Note   CSN: [id number] Arrival date & time: 01/25/20  2222     History Chief Complaint  Patient presents with  . Flank Pain    Phillip Fleming is a 24 y.o. male.  HPI     Is a 24 year old male with a history of asthma who presents with right flank pain.  Patient reports intermittent right-sided flank pain that radiates into the right abdomen.  He states that it comes and goes.  Nothing seems to make it better or worse.  He has had similar pain in the past but has not been evaluated for this pain.  He reports frequent urination and occasional hematuria.  No dysuria or fevers.  No known history of kidney stones.  Denies nausea, vomiting, diarrhea.  Currently he rates his pain at 7 out of 10.  He has not taken anything for his pain.  Past Medical History:  Diagnosis Date  . Asthma     There are no problems to display for this patient.   Past Surgical History:  Procedure Laterality Date  . thumb surgery         No family history on file.  Social History   Tobacco Use  . Smoking status: Never Smoker  . Smokeless tobacco: Never Used  Vaping Use  . Vaping Use: Never used  Substance Use Topics  . Alcohol use: Never  . Drug use: Never    Home Medications Prior to Admission medications   Medication Sig Start Date End Date Taking? Authorizing Provider  albuterol (VENTOLIN HFA) 108 (90 Base) MCG/ACT inhaler SMARTSIG:2 Puff(s) By Mouth Every 4 Hours PRN 04/10/19   [provider]  budesonide (RHINOCORT AQUA) 32 MCG/ACT nasal spray Place 1 spray into both nostrils daily. 04/25/19   Triplett, Tammy, PA-C  Calcium Carbonate Antacid (MAALOX) 600 MG chewable tablet Chew 1 tablet (600 mg total) by mouth as needed for heartburn. 04/13/19   Lorelee New, PA-C  cetirizine (ZYRTEC) 10 MG tablet TAKE 1 TABLET BY MOUTH EVERYDAY AT BEDTIME 01/23/19   [provider]  cetirizine (ZYRTEC) 10 MG tablet Take 1 tablet (10 mg total)  by mouth daily. 04/25/19   Triplett, Tammy, PA-C  [employer] CALCIUM 600 MG tablet SMARTSIG:1 Tablet(s) By Mouth As Needed 04/14/19   [provider]  ibuprofen (ADVIL) 600 MG tablet Take 1 tablet (600 mg total) by mouth every 6 (six) hours as needed. 05/07/19   Idol, Raynelle Fanning, PA-C  montelukast (SINGULAIR) 10 MG tablet TAKE 1 TABLET BY MOUTH EVERYDAY AT BEDTIME 01/23/19   [provider]  pantoprazole (PROTONIX) 40 MG tablet Take 1 tablet (40 mg total) by mouth daily. 07/05/19   Gilda Crease, MD    Allergies    Penicillins  Review of Systems   Review of Systems  Constitutional: Negative for fever.  Respiratory: Negative for shortness of breath.   Cardiovascular: Negative for chest pain.  Gastrointestinal: Negative for abdominal pain, nausea and vomiting.  Genitourinary: Positive for flank pain, frequency and hematuria. Negative for dysuria.  All other systems reviewed and are negative.   Physical Exam Updated Vital Signs BP 109/68   Pulse 85   Temp 98.1 F (36.7 C) (Oral)   Resp 18   Ht 1.905 m (6\' 3" )   Wt 68 kg   SpO2 97%   BMI 18.75 kg/m   Physical Exam Vitals and nursing note reviewed.  Constitutional:      Appearance: He is well-developed. He is not  ill-appearing.  HENT:     Head: Normocephalic and atraumatic.  Eyes:     Pupils: Pupils are equal, round, and reactive to light.  Cardiovascular:     Rate and Rhythm: Normal rate and regular rhythm.     Heart sounds: Normal heart sounds. No murmur heard.   Pulmonary:     Effort: Pulmonary effort is normal. No respiratory distress.     Breath sounds: Normal breath sounds. No wheezing.  Abdominal:     General: Bowel sounds are normal.     Palpations: Abdomen is soft.     Tenderness: There is no abdominal tenderness. There is no right CVA tenderness, left CVA tenderness or rebound.  Musculoskeletal:     Cervical back: Neck supple.     Right lower leg: No edema.     Left lower leg: No edema.    Lymphadenopathy:     Cervical: No cervical adenopathy.  Skin:    General: Skin is warm and dry.  Neurological:     Mental Status: He is alert and oriented to person, place, and time.  Psychiatric:        Mood and Affect: Mood normal.     ED Results / Procedures / Treatments   Labs (all labs ordered are listed, but only abnormal results are displayed) Labs Reviewed  URINALYSIS, ROUTINE W REFLEX MICROSCOPIC - Abnormal; Notable for the following components:      Result Value   Specific Gravity, Urine >1.030 (*)    Hgb urine dipstick TRACE (*)    All other components within normal limits  URINALYSIS, MICROSCOPIC (REFLEX)    EKG None  Radiology CT Renal Stone Study  Result Date: 01/26/2020 CLINICAL DATA:  Right flank pain and frequent urination EXAM: CT ABDOMEN AND PELVIS WITHOUT CONTRAST TECHNIQUE: Multidetector CT imaging of the abdomen and pelvis was performed following the standard protocol without IV contrast. COMPARISON:  07/05/2019 FINDINGS: Lower chest:  No contributory findings. Hepatobiliary: No focal liver abnormality.No evidence of biliary obstruction or stone. Pancreas: Unremarkable. Spleen: Unremarkable. Adrenals/Urinary Tract: Negative adrenals. No hydronephrosis or ureteral stone. Three punctate right renal calculi. Unremarkable bladder. Stomach/Bowel: Formed stool throughout the colon. No bowel obstruction or visible inflammation. No appendicitis. Vascular/Lymphatic: No acute vascular abnormality. No mass or adenopathy. Reproductive:No pathologic findings. Other: No ascites or pneumoperitoneum. Musculoskeletal: No acute abnormalities. IMPRESSION: 1. Generalized stool retention, please correlate for constipation. 2. No hydronephrosis or ureteral calculus. 3. Punctate right renal calculi. Electronically Signed   By: Marnee Spring M.D.   On: 01/26/2020 06:07    Procedures Procedures (including critical care time)  Medications Ordered in ED Medications - No data to  display  ED Course  I have reviewed the triage vital signs and the nursing notes.  Pertinent labs & imaging results that were available during my care of the patient were reviewed by me and considered in my medical decision making (see chart for details).    MDM Rules/Calculators/A&P                           Patient presents with flank pain.  He is overall nontoxic-appearing and vital signs are reassuring.  Reports intermittent pain that comes and goes.  Also reports some urinary symptoms.  Considerations include but not limited to, kidney stone, UTI, intra-abdominal pathology such as colitis.  Urinalysis obtained.  No evidence of infection.  Trace hemoglobin.  CT renal stone study obtained and shows no evidence of obstructing stone.  There is evidence of significant retained stool.  On recheck, patient does report that he has issues with constipation.  I have reviewed the CT images independently and much of the retained stool is on the right side.  This could very well be causing the patient's discomfort.  Recommend MiraLAX twice daily and increasing fiber and fluids in diet.  Patient stated understanding.  After history, exam, and medical workup I feel the patient has been appropriately medically screened and is safe for discharge home. Pertinent diagnoses were discussed with the patient. Patient was given return precautions.   Final Clinical Impression(s) / ED Diagnoses Final diagnoses:  Flank pain  Constipation, unspecified constipation type    Rx / DC Orders ED Discharge Orders    None       Shon Baton, MD 01/26/20 [phone]

## 2022-07-02 IMAGING — CT CT RENAL STONE PROTOCOL
2 of 3 series · 17 of 46 positions shown, 19 images · non-contrast
Comparison: 07/05/2019

CLINICAL DATA: Right flank pain and frequent urination

EXAM:
CT ABDOMEN AND PELVIS WITHOUT CONTRAST
TECHNIQUE: Multidetector CT imaging of the abdomen and pelvis was performed
following the standard protocol without IV contrast.

[Series 2: axial st · axial · 0.56mm/px · z∈[+1125,+1460]mm · 14 of 79 slices shown, 16 images]
[im 6/79  soft-tissue]
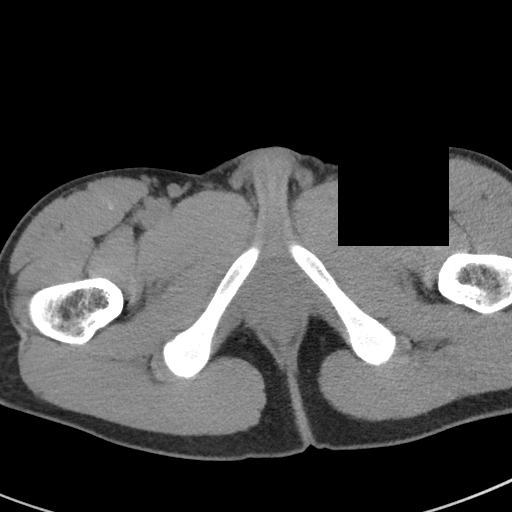
[im 6/79  bone]
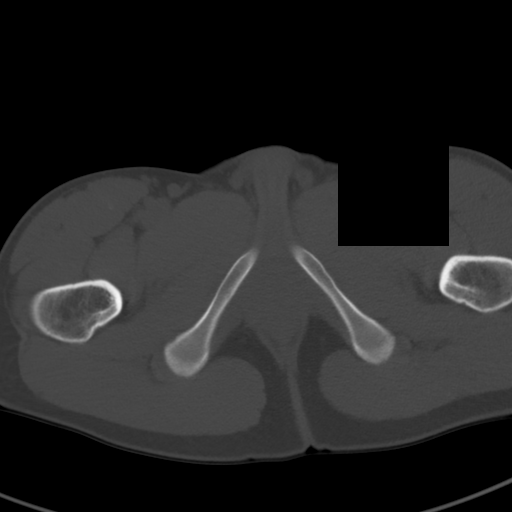
[im 11/79  soft-tissue]
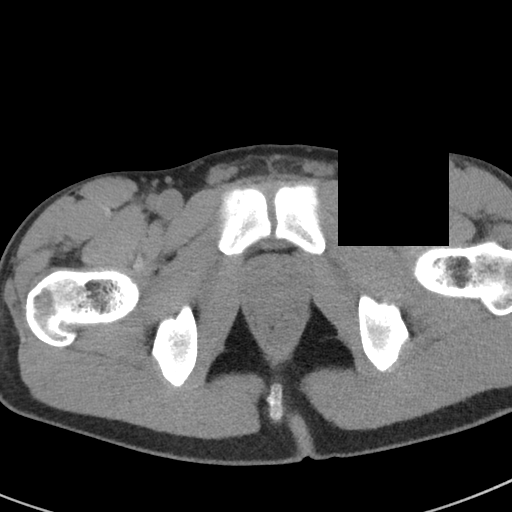
[im 16/79  soft-tissue]
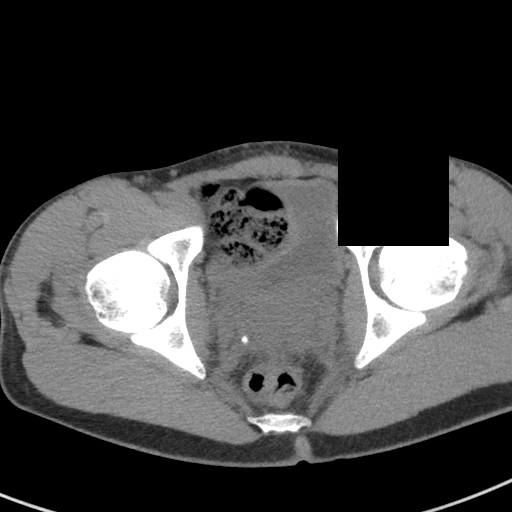
[im 21/79  soft-tissue]
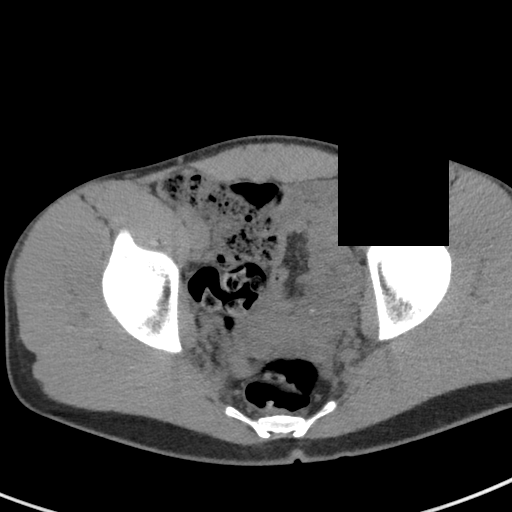
[im 26/79  soft-tissue]
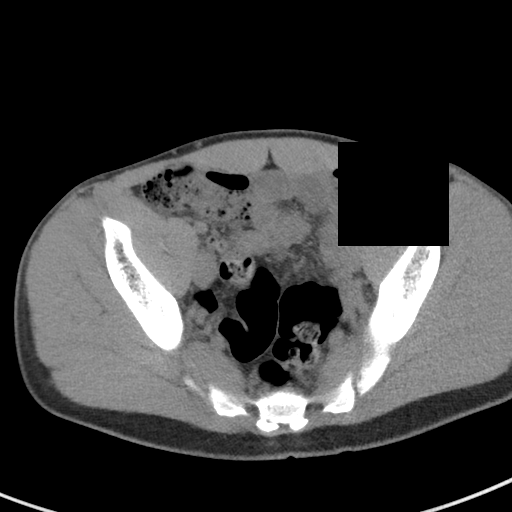
[im 31/79  soft-tissue]
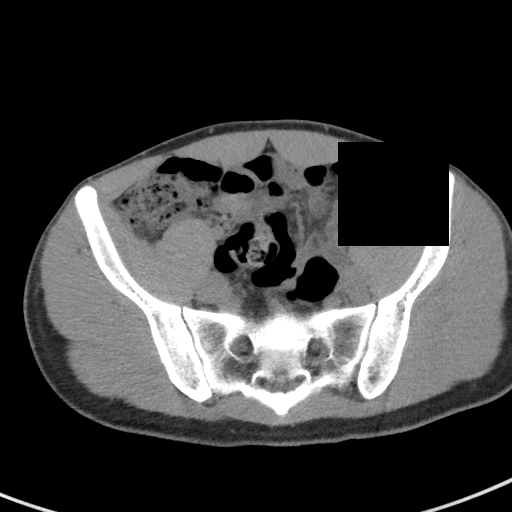
[im 36/79  soft-tissue]
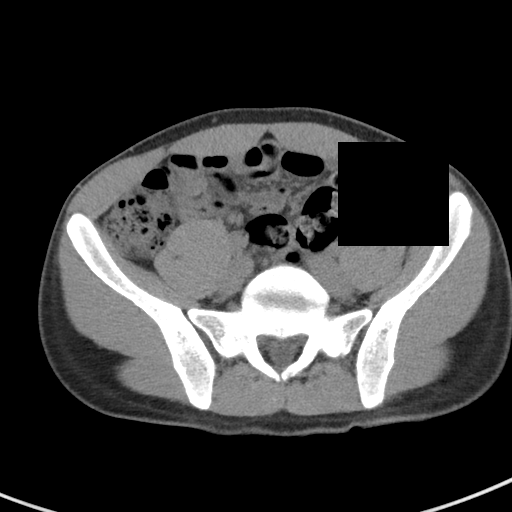
[im 43/79  soft-tissue]
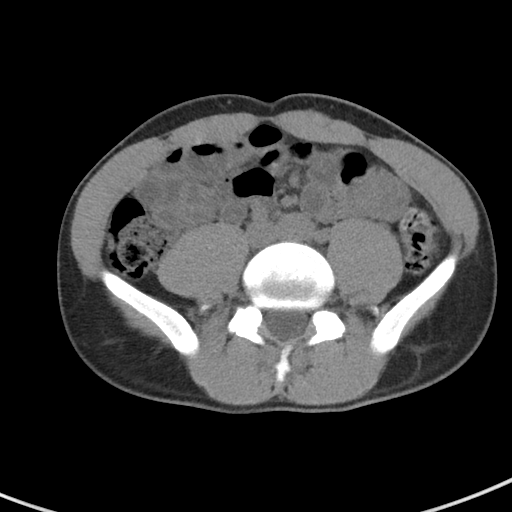
[im 48/79  soft-tissue]
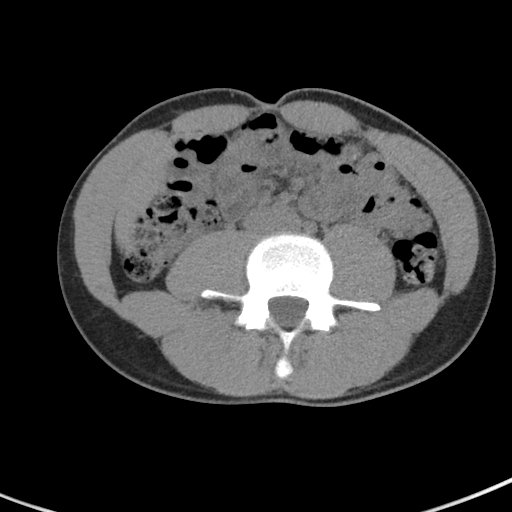
[im 48/79  bone]
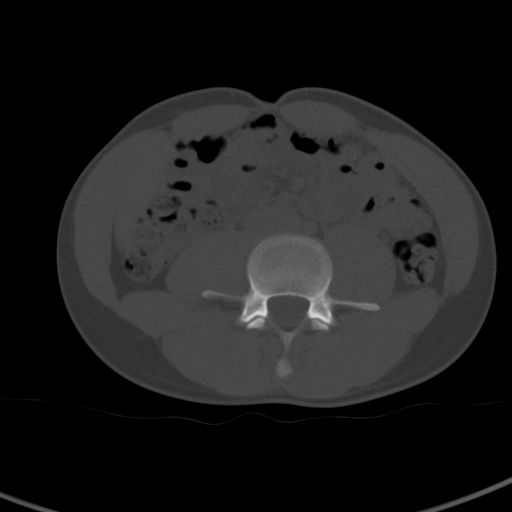
[im 53/79  soft-tissue]
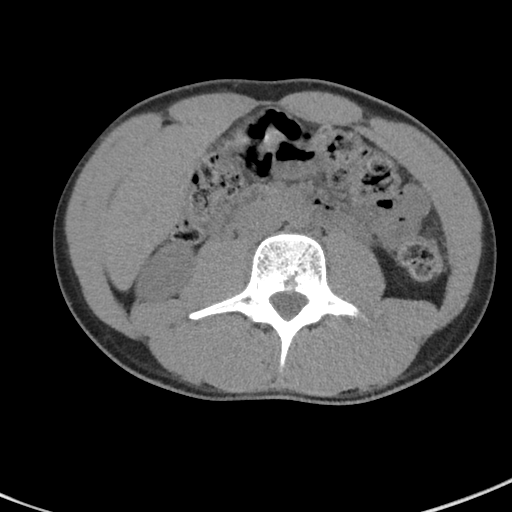
[im 58/79  soft-tissue]
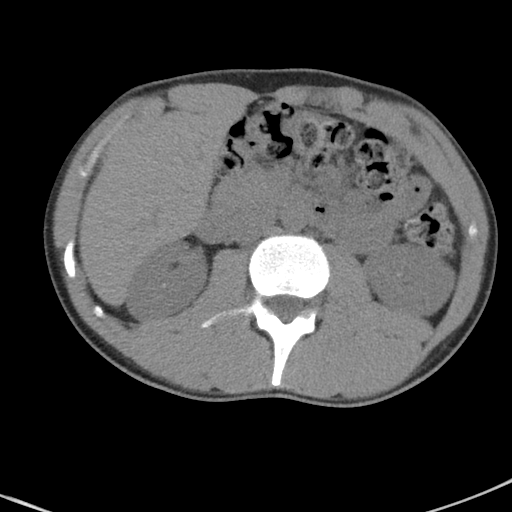
[im 63/79  soft-tissue]
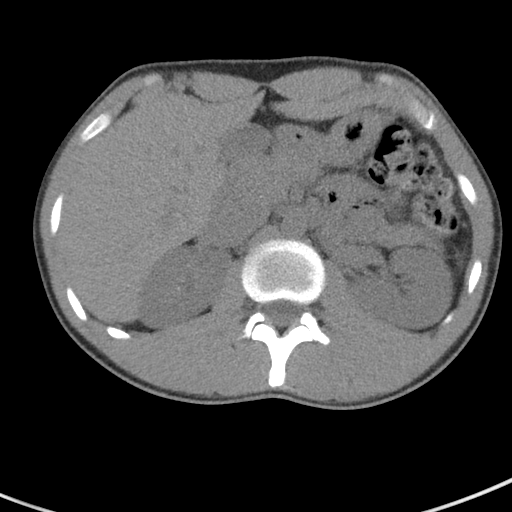
[im 68/79  soft-tissue]
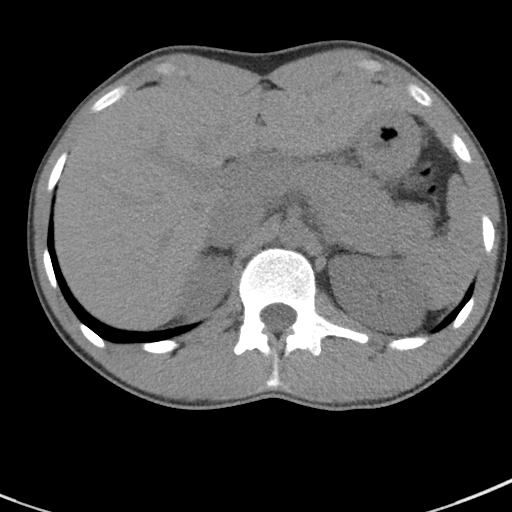
[im 73/79  soft-tissue]
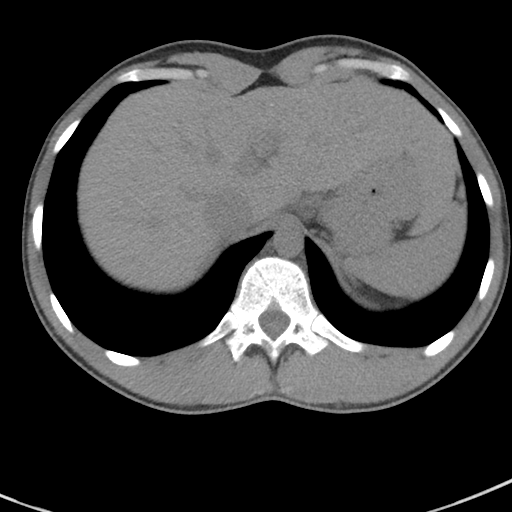

[Series 5: coronal st · coronal · 0.61mm/px · 3 of 73 slices shown]
[im 25/73  soft-tissue]
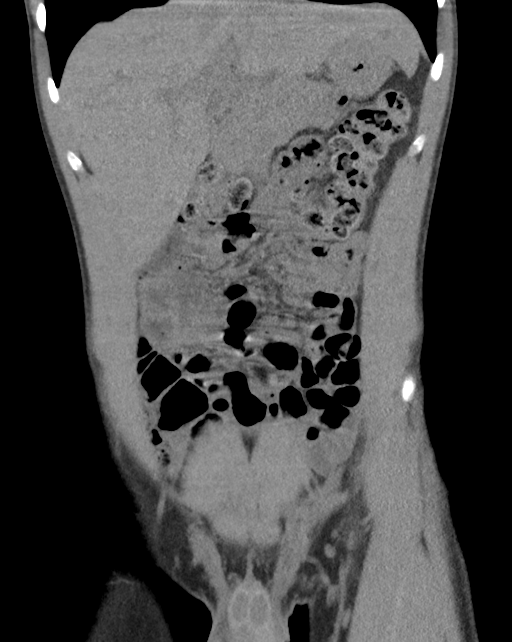
[im 33/73  soft-tissue]
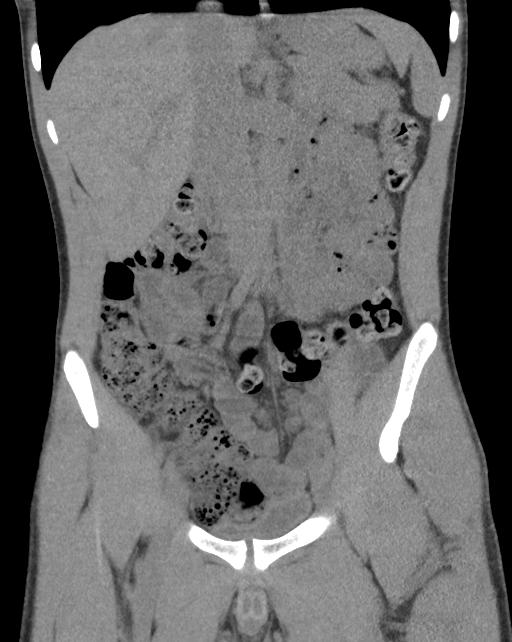
[im 41/73  soft-tissue]
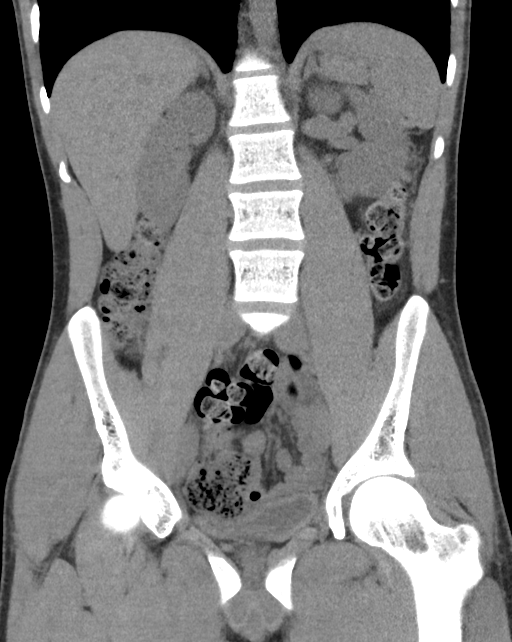

[17 of 46 positions shown; findings below may reference images not displayed]

FINDINGS: Lower chest:  No contributory findings.

Hepatobiliary: No focal liver abnormality.No evidence of biliary
obstruction or stone.

Pancreas: Unremarkable.

Spleen: Unremarkable.

Adrenals/Urinary Tract: Negative adrenals. No hydronephrosis or
ureteral stone. Three punctate right renal calculi. Unremarkable
bladder.

Stomach/Bowel: Formed stool throughout the colon. No bowel
obstruction or visible inflammation. No appendicitis.

Vascular/Lymphatic: No acute vascular abnormality. No mass or
adenopathy.

Reproductive:No pathologic findings.

Other: No ascites or pneumoperitoneum.

Musculoskeletal: No acute abnormalities.
IMPRESSION: 1. Generalized stool retention, please correlate for constipation.
2. No hydronephrosis or ureteral calculus.
3. Punctate right renal calculi.
